# Patient Record
Sex: Female | Born: 1970 | Race: White | Hispanic: Yes | Marital: Single | Smoking: Current some day smoker
Health system: Southern US, Community
[De-identification: ages and names within clinical notes are randomized; demographics above are authoritative.]

## PROBLEM LIST (undated history)

## (undated) HISTORY — PX: OTHER SURGICAL HISTORY: SHX169

---

## 2018-05-06 ENCOUNTER — Other Ambulatory Visit (HOSPITAL_COMMUNITY): Payer: Self-pay | Admitting: *Deleted

## 2018-05-06 DIAGNOSIS — Z1231 Encounter for screening mammogram for malignant neoplasm of breast: Secondary | ICD-10-CM

## 2018-05-19 ENCOUNTER — Other Ambulatory Visit: Payer: Self-pay | Admitting: *Deleted

## 2018-05-19 DIAGNOSIS — Z0000 Encounter for general adult medical examination without abnormal findings: Secondary | ICD-10-CM

## 2018-05-20 NOTE — Addendum Note (Signed)
Addended by: Deforest Hoyles D on: 05/20/2018 09:50 AM   Modules accepted: Orders

## 2018-05-22 ENCOUNTER — Ambulatory Visit: Payer: Self-pay

## 2018-05-22 ENCOUNTER — Inpatient Hospital Stay: Payer: Self-pay

## 2018-08-12 ENCOUNTER — Other Ambulatory Visit (HOSPITAL_COMMUNITY): Payer: Self-pay | Admitting: *Deleted

## 2018-08-12 DIAGNOSIS — Z1231 Encounter for screening mammogram for malignant neoplasm of breast: Secondary | ICD-10-CM

## 2018-08-13 ENCOUNTER — Ambulatory Visit (HOSPITAL_COMMUNITY): Payer: Self-pay

## 2019-01-05 ENCOUNTER — Ambulatory Visit (HOSPITAL_COMMUNITY)
Admission: RE | Admit: 2019-01-05 | Discharge: 2019-01-05 | Disposition: A | Discharge status: Home / Self Care | Payer: Self-pay | Source: Ambulatory Visit | Attending: Obstetrics and Gynecology | Admitting: Obstetrics and Gynecology

## 2019-01-05 ENCOUNTER — Other Ambulatory Visit: Payer: Self-pay

## 2019-01-05 ENCOUNTER — Ambulatory Visit
Admission: RE | Admit: 2019-01-05 | Discharge: 2019-01-05 | Disposition: A | Discharge status: Home / Self Care | Payer: No Typology Code available for payment source | Source: Ambulatory Visit | Attending: Obstetrics and Gynecology | Admitting: Obstetrics and Gynecology

## 2019-01-05 ENCOUNTER — Encounter (HOSPITAL_COMMUNITY): Payer: Self-pay

## 2019-01-05 DIAGNOSIS — Z1239 Encounter for other screening for malignant neoplasm of breast: Secondary | ICD-10-CM | POA: Insufficient documentation

## 2019-01-05 DIAGNOSIS — Z1231 Encounter for screening mammogram for malignant neoplasm of breast: Secondary | ICD-10-CM

## 2019-01-05 NOTE — Progress Notes (Signed)
No complaints today.   Pap Smear: Pap smear not completed today. Last Pap smear was in May 2019 in [place] and normal per patient. Per patient has no history of an abnormal Pap smear. No Pap smear results are in Epic.  Physical exam: Breasts Breasts symmetrical. No skin abnormalities bilateral breasts. No nipple retraction bilateral breasts. No nipple discharge bilateral breasts. No lymphadenopathy. No lumps palpated bilateral breasts. No complaints of pain or tenderness on exam. Referred patient to the [facility] for a screening mammogram. Appointment scheduled for Tuesday, January 05, 2019 at 1020.        Pelvic/Bimanual No Pap smear completed today since last Pap smear was in May 2019 per patient. Pap smear not indicated per BCCCP guidelines.   Smoking History: Patient is a current smoker. Discussed smoking cessation with patient. Referred to the [facility] Quitline and gave resources to the free smoking cessation classes at [facility].  Patient Navigation: Patient education provided. Access to services provided for patient through [facility] program. Spanish interpreter provided.   Breast and Cervical Cancer Risk Assessment: Patient has no family history of breast cancer, known genetic mutations, or radiation treatment to the chest before age 37. Patient has no history of cervical dysplasia, immunocompromised, or DES exposure in-utero.  Risk Assessment    Risk Scores      01/05/2019   Last edited by: Armond Hang, LPN   5-year risk: 0.9 %   Lifetime risk: 8.8 %         Used Spanish interpreter Rudene Anda from [place].

## 2019-01-05 NOTE — Patient Instructions (Signed)
Explained breast self awareness with [employer]. Patient did not need a Pap smear today due to last Pap smear was in May 2019 per patient. Let her know BCCCP will cover Pap smears every 3 years unless has a history of abnormal Pap smears. Referred patient to the [facility] for a screening mammogram. Appointment scheduled for Tuesday, January 05, 2019 at 1020. Patient aware of appointment and will be there. Let patient know the [facility] will follow up with her within the next couple weeks with results her mammogram by letter or phone. Discussed smoking cessation with patient. Referred to the [facility] Quitline and gave resources to the free smoking cessation classes at [facility]. Tabitha Underwood verbalized understanding.  Tabitha Underwood, Arvil Chaco, RN 9:59 AM

## 2019-01-06 ENCOUNTER — Encounter (HOSPITAL_COMMUNITY): Payer: Self-pay | Admitting: *Deleted

## 2019-12-28 ENCOUNTER — Other Ambulatory Visit: Payer: Self-pay | Admitting: Obstetrics and Gynecology

## 2019-12-28 DIAGNOSIS — Z1231 Encounter for screening mammogram for malignant neoplasm of breast: Secondary | ICD-10-CM

## 2020-01-25 ENCOUNTER — Ambulatory Visit
Admission: RE | Admit: 2020-01-25 | Discharge: 2020-01-25 | Disposition: A | Discharge status: Home / Self Care | Payer: No Typology Code available for payment source | Source: Ambulatory Visit | Attending: Obstetrics and Gynecology | Admitting: Obstetrics and Gynecology

## 2020-01-25 ENCOUNTER — Ambulatory Visit: Payer: Self-pay

## 2020-01-25 ENCOUNTER — Other Ambulatory Visit: Payer: Self-pay

## 2020-01-25 ENCOUNTER — Ambulatory Visit: Payer: Self-pay | Admitting: *Deleted

## 2020-01-25 VITALS — BP 150/90 | Temp 97.1°F | Wt 166.6 lb

## 2020-01-25 DIAGNOSIS — Z1231 Encounter for screening mammogram for malignant neoplasm of breast: Secondary | ICD-10-CM

## 2020-01-25 DIAGNOSIS — Z1239 Encounter for other screening for malignant neoplasm of breast: Secondary | ICD-10-CM

## 2020-01-25 NOTE — Progress Notes (Addendum)
Tabitha Underwood is a 49 y.o. female who presents to [facility] clinic today with no complaints.    Pap Smear: Pap smear not completed today. Last Pap smear was in May 2019 in [place] and normal per patient. Per patient has no history of an abnormal Pap smear. No Pap smear results are in Epic.   Physical exam: Breasts Breasts symmetrical. No skin abnormalities left breast. 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 No nipple retraction bilateral breasts. No nipple discharge bilateral breasts. No lymphadenopathy. No lumps palpated bilateral breasts. No complaints of pain or tenderness on exam.       Pelvic/Bimanual Pap is not indicated today per BCCCP guidelines.   Smoking History: Patient is a current smoker. Discussed smoking cessation with patient. Referred to the [facility] Quitline and gave resources to the free smoking cessation classes at [facility].   Patient Navigation: Patient education provided. Access to services provided for patient through [place] program. Spanish interpreter Natale Lay from [facility] provided.   Colorectal Cancer Screening: Per patient has never had colonoscopy completed. No complaints today.    Breast and Cervical Cancer Risk Assessment: Patient does not have family history of breast cancer, known genetic mutations, or radiation treatment to the chest before age 89. Patient does not have history of cervical dysplasia, immunocompromised, or DES exposure in-utero.  Risk Assessment    Risk Scores      01/25/2020 01/05/2019   Last edited by: Narda Rutherford, LPN Stoney Bang H, LPN   5-year risk: 1 % 0.9 %   Lifetime risk: 8.6 % 8.8 %          A: BCCCP exam without pap smear No complaints.  P: Referred patient to the [facility] for a screening mammogram on the mobile unit. Appointment scheduled Tuesday, January 25, 2020 at 1130.  Priscille Heidelberg, RN 01/25/2020 9:16 AM

## 2020-01-25 NOTE — Patient Instructions (Addendum)
Explained breast self awareness with [employer]. Patient did not need a Pap smear today due to last Pap smear was in May 2019 per patient. Let her know BCCCP will cover Pap smears every 3 years unless has a history of abnormal Pap smears. Referred patient to the [facility] for a screening mammogram on the mobile unit. Appointment scheduled Tuesday, January 25, 2020 at 1130. Patient escorted to mobile unit following BCCCP appointment for her mammogram. Let patient know the Breast Center will follow up with her within the next couple weeks with results of her mammogram by letter or phone. Discussed smoking cessation with patient. Referred to the [facility] Quitline and gave resources to the free smoking cessation classes at [facility]. Rodneshia Guardino verbalized understanding.  Wolfgang Finigan, Kathaleen Maser, RN 9:19 AM

## 2020-02-07 ENCOUNTER — Other Ambulatory Visit: Payer: Self-pay | Admitting: Obstetrics and Gynecology

## 2020-02-07 DIAGNOSIS — R921 Mammographic calcification found on diagnostic imaging of breast: Secondary | ICD-10-CM

## 2020-02-22 ENCOUNTER — Other Ambulatory Visit: Payer: Self-pay | Admitting: Obstetrics and Gynecology

## 2020-02-22 ENCOUNTER — Ambulatory Visit
Admission: RE | Admit: 2020-02-22 | Discharge: 2020-02-22 | Disposition: A | Discharge status: Home / Self Care | Payer: No Typology Code available for payment source | Source: Ambulatory Visit | Attending: Obstetrics and Gynecology | Admitting: Obstetrics and Gynecology

## 2020-02-22 ENCOUNTER — Other Ambulatory Visit: Payer: Self-pay

## 2020-02-22 DIAGNOSIS — R921 Mammographic calcification found on diagnostic imaging of breast: Secondary | ICD-10-CM

## 2020-03-01 ENCOUNTER — Other Ambulatory Visit: Payer: Self-pay

## 2020-03-01 ENCOUNTER — Ambulatory Visit
Admission: RE | Admit: 2020-03-01 | Discharge: 2020-03-01 | Disposition: A | Discharge status: Home / Self Care | Payer: No Typology Code available for payment source | Source: Ambulatory Visit | Attending: Obstetrics and Gynecology | Admitting: Obstetrics and Gynecology

## 2020-03-01 DIAGNOSIS — R921 Mammographic calcification found on diagnostic imaging of breast: Secondary | ICD-10-CM

## 2022-01-29 IMAGING — MG MM BREAST BX W LOC DEV 1ST LESION IMAGE BX SPEC STEREO GUIDE*L*
8 of 12 series · 8 of 24 positions shown · non-contrast
Comparison: Previous exams.
COMPARISON: Previous exams.

Addendum:
CLINICAL DATA: Patient presents for stereotactic core needle biopsy
of left breast calcifications.

EXAM:
LEFT BREAST STEREOTACTIC CORE NEEDLE BIOPSY

[L (1 of 8)]
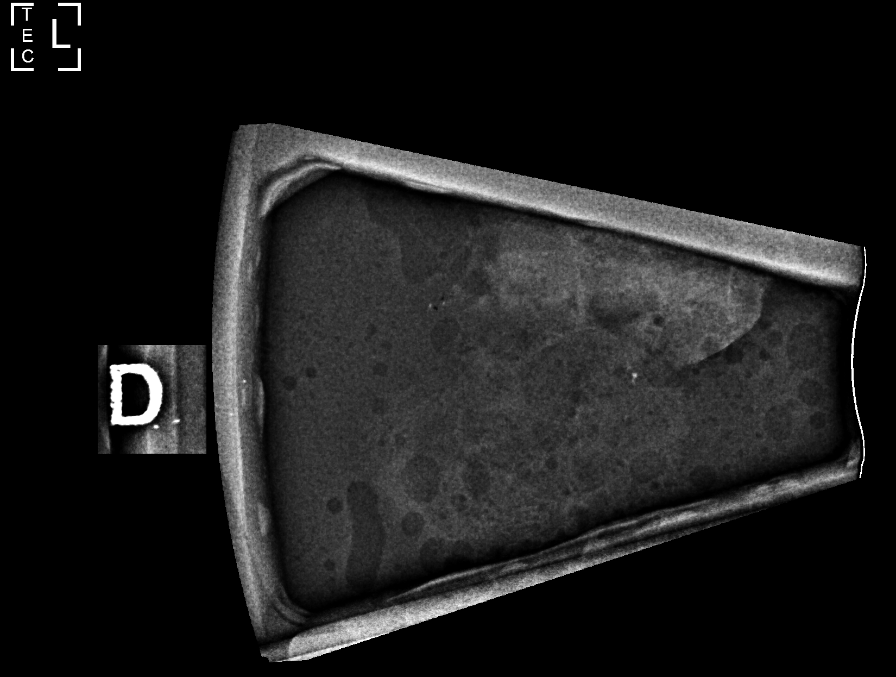

[L (2 of 8)]
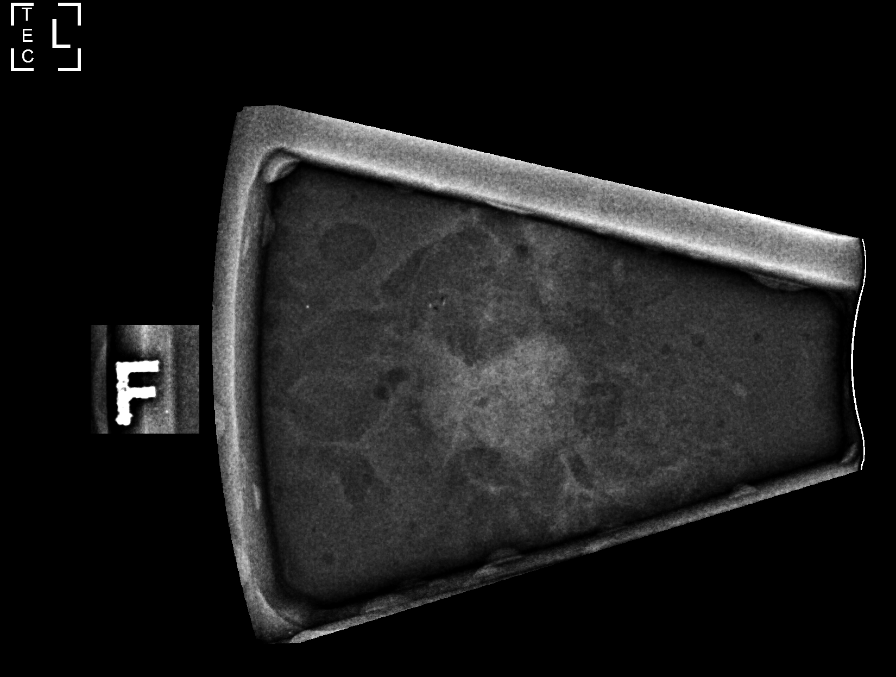

[L (3 of 8)]
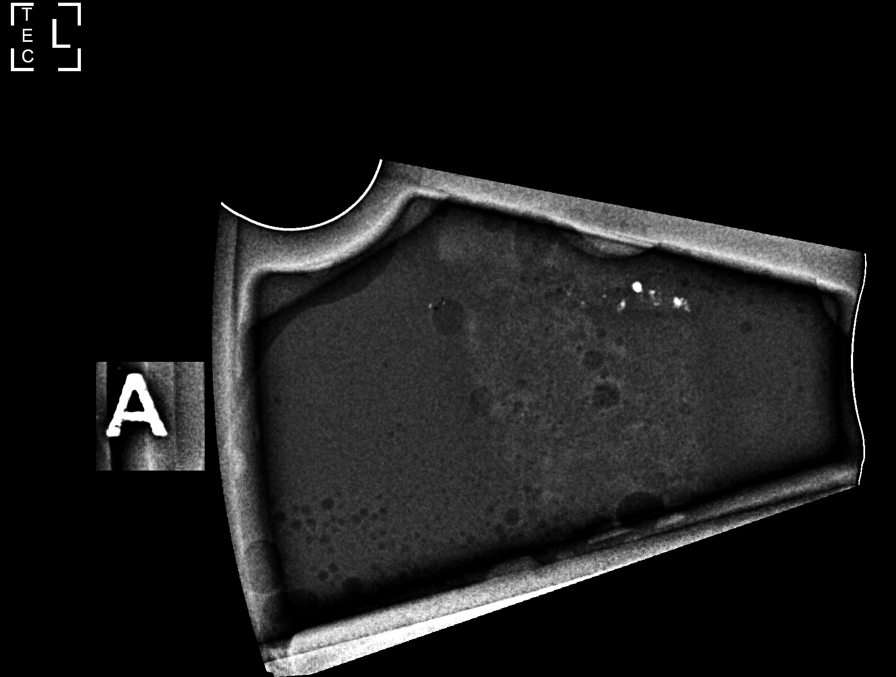

[L (4 of 8)]
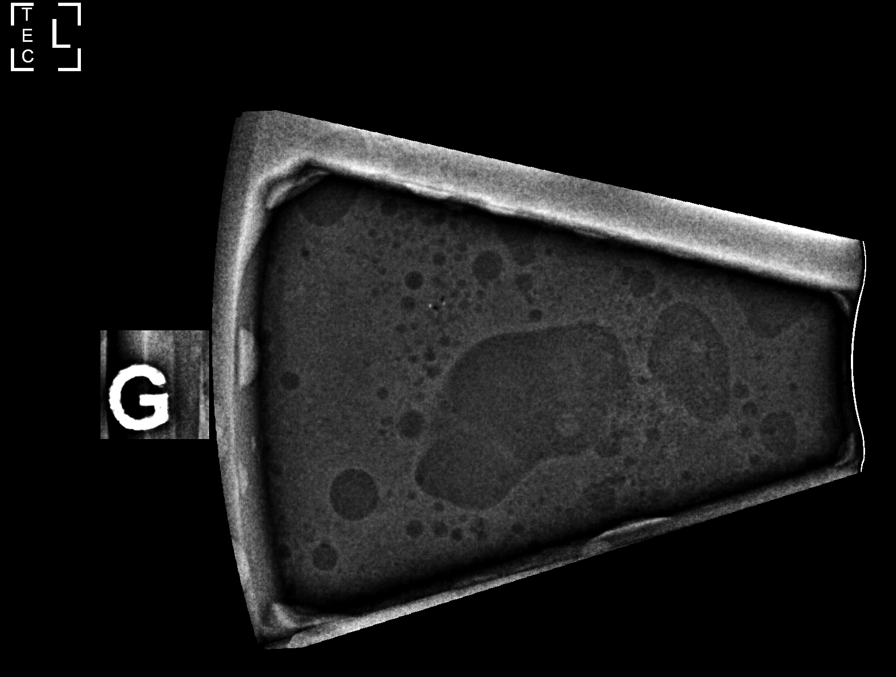

[L (5 of 8)]
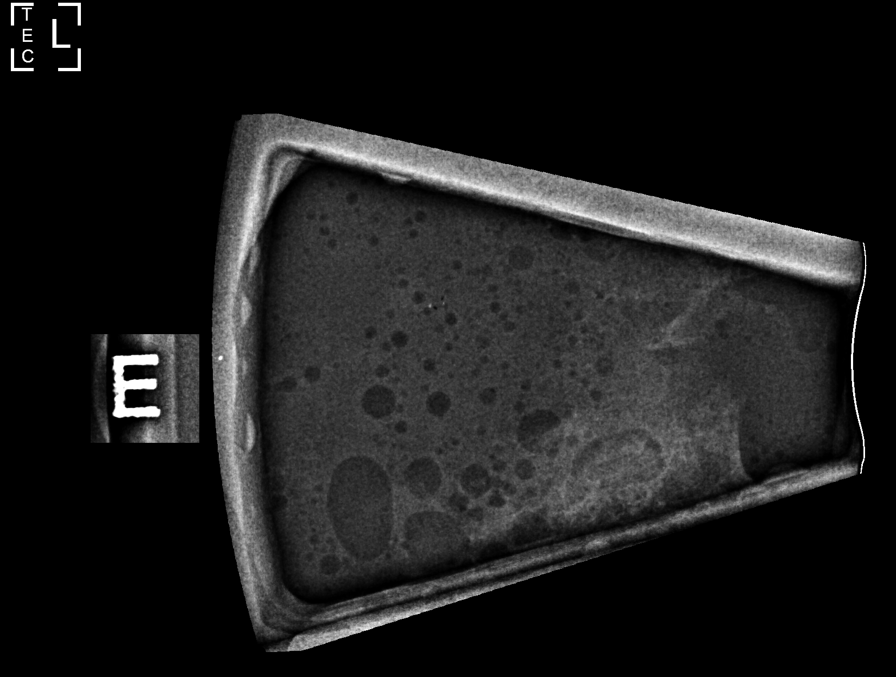

[L (6 of 8)]
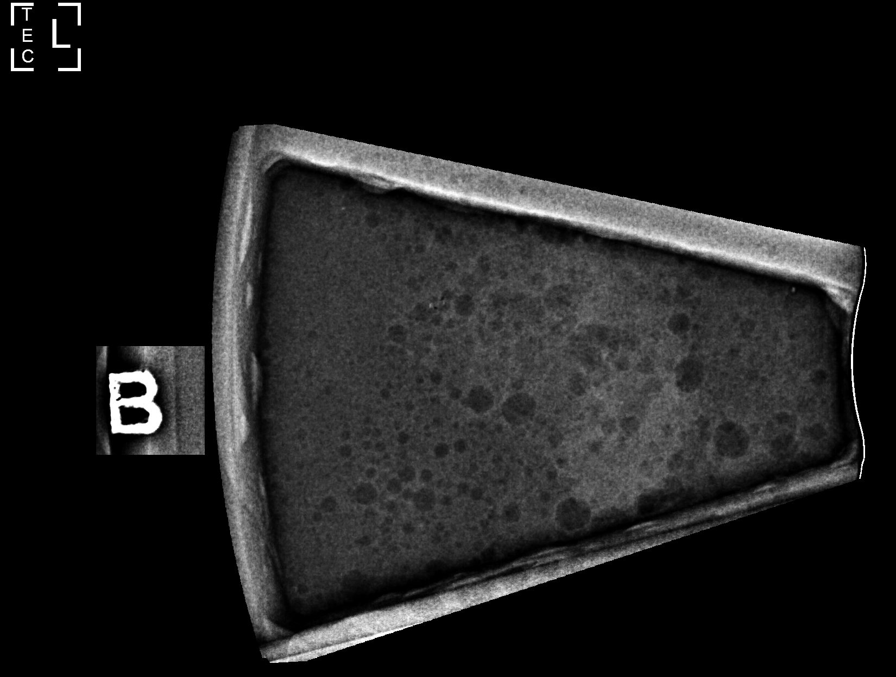

[L (7 of 8)]
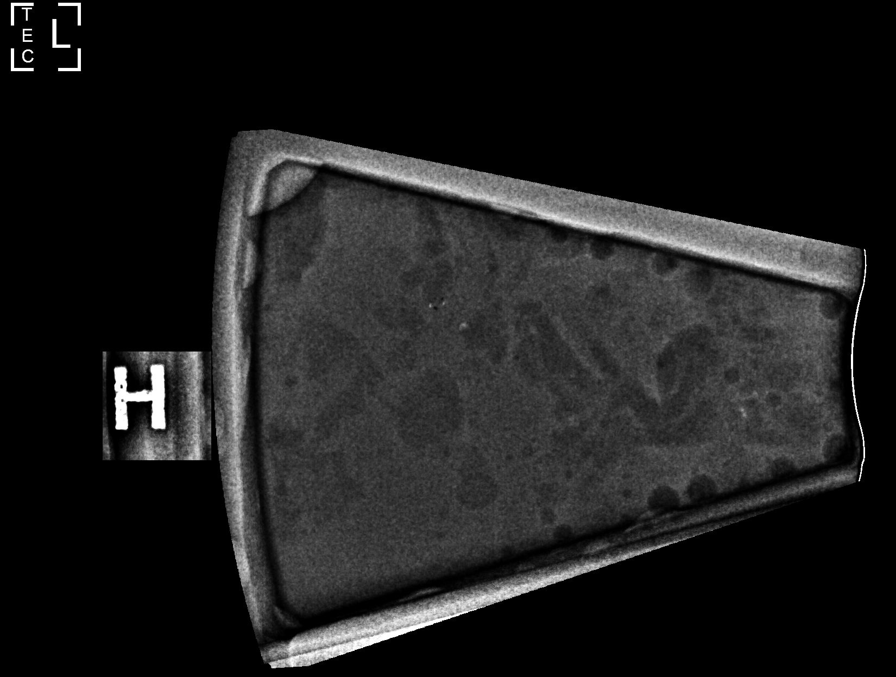

[L (8 of 8)]
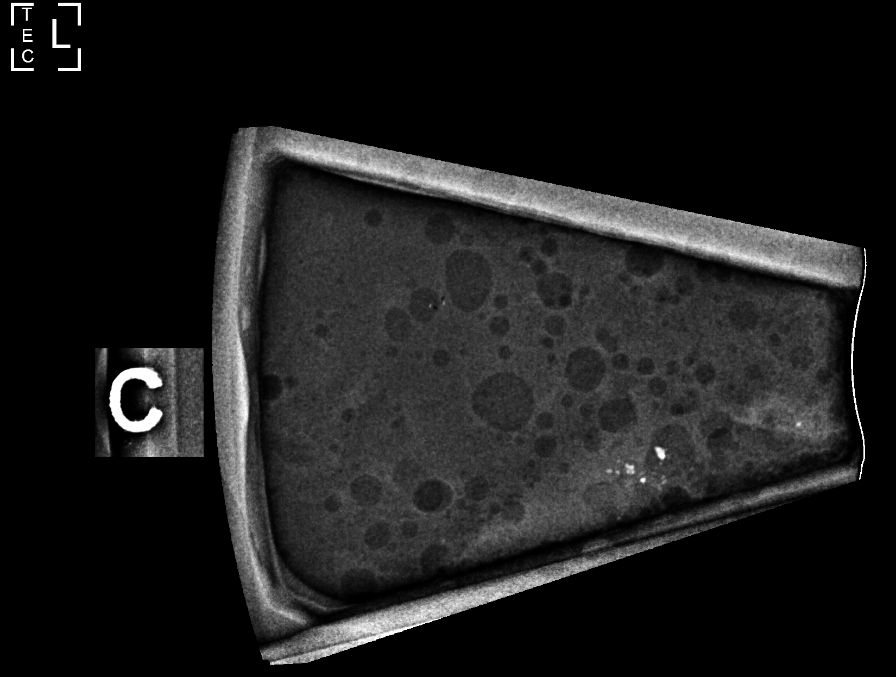

[8 of 24 positions shown; findings below may reference images not displayed]



Using sterile technique and 1% Lidocaine as local anesthetic, under
stereotactic guidance, a 9 gauge vacuum assisted device was used to
perform core needle biopsy of calcifications in the upper inner
quadrant of the left breast using a superior approach. Specimen
radiograph was performed showing multiple calcifications for which
biopsy was performed. Specimens with calcifications are identified
for pathology.

Lesion quadrant: Upper inner quadrant

At the conclusion of the procedure, a coil shaped tissue marker clip
was deployed into the biopsy cavity. Follow-up 2-view mammogram was
performed and dictated separately.
IMPRESSION: Stereotactic-guided biopsy of left breast calcifications. No
apparent complications.

ADDENDUM:
Pathology revealed FIBROCYSTIC CHANGE AND SCLEROSING ADENOSIS WITH
CALCIFICATIONS of the Left breast, upper inner quadrant. This was
found to be concordant by Dr. Yrn Tomas.

Pathology results were discussed with the patient by telephone by
patient reported doing well after the biopsy with tenderness at the
site. Post biopsy instructions and care were reviewed and questions
were answered. The patient was encouraged to call The [REDACTED]

The patient was instructed to return for annual screening
mammography and informed a reminder notice would be sent regarding
this appointment.

Pathology results reported by Sabina Sandi Zrim, RN on 03/02/2020.



Using sterile technique and 1% Lidocaine as local anesthetic, under
stereotactic guidance, a 9 gauge vacuum assisted device was used to
perform core needle biopsy of calcifications in the upper inner
quadrant of the left breast using a superior approach. Specimen
radiograph was performed showing multiple calcifications for which
biopsy was performed. Specimens with calcifications are identified
for pathology.

Lesion quadrant: Upper inner quadrant

At the conclusion of the procedure, a coil shaped tissue marker clip
was deployed into the biopsy cavity. Follow-up 2-view mammogram was
performed and dictated separately.
IMPRESSION: Stereotactic-guided biopsy of left breast calcifications. No
apparent complications.
